# Patient Record
Sex: Female | Born: 1970 | Smoking: Never smoker
Health system: Southern US, Community
[De-identification: ages and names within clinical notes are randomized; demographics above are authoritative.]

## PROBLEM LIST (undated history)

## (undated) DIAGNOSIS — R51 Headache: Secondary | ICD-10-CM

## (undated) DIAGNOSIS — K219 Gastro-esophageal reflux disease without esophagitis: Secondary | ICD-10-CM

## (undated) HISTORY — PX: WISDOM TOOTH EXTRACTION: SHX21

---

## 2005-05-11 ENCOUNTER — Ambulatory Visit: Payer: Self-pay | Admitting: Obstetrics and Gynecology

## 2006-03-18 ENCOUNTER — Ambulatory Visit: Payer: Self-pay | Admitting: Obstetrics and Gynecology

## 2006-06-25 ENCOUNTER — Ambulatory Visit: Payer: Self-pay | Admitting: Obstetrics and Gynecology

## 2006-09-30 ENCOUNTER — Ambulatory Visit: Payer: Self-pay | Admitting: Specialist

## 2007-06-08 ENCOUNTER — Ambulatory Visit: Payer: Self-pay | Admitting: Obstetrics and Gynecology

## 2008-07-06 ENCOUNTER — Ambulatory Visit: Payer: Self-pay | Admitting: Obstetrics and Gynecology

## 2009-07-16 ENCOUNTER — Other Ambulatory Visit: Payer: Self-pay | Admitting: Obstetrics & Gynecology

## 2011-07-27 ENCOUNTER — Ambulatory Visit: Payer: Self-pay | Admitting: General Practice

## 2011-08-11 ENCOUNTER — Ambulatory Visit: Payer: Self-pay | Admitting: General Practice

## 2011-08-12 ENCOUNTER — Ambulatory Visit: Payer: Self-pay | Admitting: General Practice

## 2011-09-02 ENCOUNTER — Encounter (HOSPITAL_BASED_OUTPATIENT_CLINIC_OR_DEPARTMENT_OTHER): Payer: Self-pay | Admitting: *Deleted

## 2011-09-09 NOTE — H&P (Signed)
  A Division of [facility]  Loreta Ave, M.D.     Robert A. Thurston Hole, M.D.     Lunette Stands, M.D. Eulas Post, M.D.    Buford Dresser, M.D. Estell Harpin, M.D. Ralene Cork, D.O.          Genene Churn. Barry Dienes, PA-C            Kirstin A. Shepperson, PA-C [place], OPA-C   RE: [name], [name]                                [id number]      DOB: 03-14-1971 PROGRESS NOTE: 08-18-11 Chief complaint: Left knee pain and instability.  History of present illness: 41 year-old female who is a new patient to the office.  Presents with the above complaint.  States that on July 26, 2011 she was playing basketball when she was bumped from the right side and she suffered a varus type stress to her left knee.  Did not have much pain after this incident, but she felt like her knee was unstable.  Swelling after.  Treated conservatively for a few weeks and eventually had an MRI on Aug 12, 2011.  This showed an ACL tear and edema noted along the posterolateral aspect of the tibial plateau and about the popliteus tendon.  Posterolateral corner syndrome cannot be excluded.  Patient employed as a Engineer, maintenance and was referred to our office by her employer, Dr. Rushie Chestnut.   Current medications: Mobic. Allergies: No known drug allergies. Past medical/surgical history: Healthy. Review of systems: All other systems are unremarkable.   Family history: Positive for hypertension, heart disease and arthritis. Social history: Does not smoke, admits occasional alcohol use.  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    EXAMINATION: Height: 5?0.  Weight: 136 pounds.  Pleasant Hispanic female, alert and oriented x 3 and in no acute distress.  Gait is somewhat antalgic.  Negative log roll bilateral hips.  Negative straight leg raise.  Left knee good range of motion.  She does have some swelling with a small effusion.  Positive  anterior drawer and Lachman.  Moderately tender over the medial joint line.  Collateral ligaments stable.  Negative patellar apprehension.  Calf non-tender.  Neurovascularly intact.  Skin warm and dry.  No increase in respiratory effort.    X-RAYS: Left knee, AP, lateral and sunrise views, show good bony anatomy.  No acute changes.    IMPRESSION: Left knee pain and instability secondary to ACL tear.    PLAN: Patient and her sister, who is present, advised that the only viable option at this point would be left knee arthroscopy with debridement and allograft ACL reconstruction.  Surgical procedure, along with potential rehab/recovery time discussed.  All questions answered.  She is very tender over her medial meniscus and we will have meniscal repair system available, even though her scan did not show an obvious meniscus tear.  She is given a J&J brace today.  With the job that she does anticipate being out of work for at least 6-12 weeks, unless there is some light duty deskwork available to her.  All questions answered.    Loreta Ave, M.D.   Electronically verified by Loreta Ave, M.D. DFM(JMO):jjh D 08-19-11

## 2011-09-10 ENCOUNTER — Encounter (HOSPITAL_BASED_OUTPATIENT_CLINIC_OR_DEPARTMENT_OTHER)
Admission: RE | Disposition: A | Discharge status: Home / Self Care | Payer: Self-pay | Source: Ambulatory Visit | Attending: Orthopedic Surgery

## 2011-09-10 ENCOUNTER — Encounter (HOSPITAL_BASED_OUTPATIENT_CLINIC_OR_DEPARTMENT_OTHER): Payer: Self-pay

## 2011-09-10 ENCOUNTER — Encounter (HOSPITAL_BASED_OUTPATIENT_CLINIC_OR_DEPARTMENT_OTHER): Payer: Self-pay | Admitting: Anesthesiology

## 2011-09-10 ENCOUNTER — Ambulatory Visit (HOSPITAL_BASED_OUTPATIENT_CLINIC_OR_DEPARTMENT_OTHER)
Admission: RE | Admit: 2011-09-10 | Discharge: 2011-09-10 | Disposition: A | Discharge status: Home / Self Care | Payer: PRIVATE HEALTH INSURANCE | Source: Ambulatory Visit | Attending: Orthopedic Surgery | Admitting: Orthopedic Surgery

## 2011-09-10 ENCOUNTER — Ambulatory Visit (HOSPITAL_BASED_OUTPATIENT_CLINIC_OR_DEPARTMENT_OTHER): Payer: PRIVATE HEALTH INSURANCE | Admitting: Anesthesiology

## 2011-09-10 DIAGNOSIS — M23302 Other meniscus derangements, unspecified lateral meniscus, unspecified knee: Secondary | ICD-10-CM | POA: Insufficient documentation

## 2011-09-10 DIAGNOSIS — M2350 Chronic instability of knee, unspecified knee: Secondary | ICD-10-CM | POA: Insufficient documentation

## 2011-09-10 DIAGNOSIS — Z4789 Encounter for other orthopedic aftercare: Secondary | ICD-10-CM

## 2011-09-10 HISTORY — DX: Gastro-esophageal reflux disease without esophagitis: K21.9

## 2011-09-10 HISTORY — DX: Headache: R51

## 2011-09-10 SURGERY — KNEE ARTHROSCOPY WITH ANTERIOR CRUCIATE LIGAMENT (ACL) REPAIR
Anesthesia: General | Site: Knee | Laterality: Left | Wound class: Clean

## 2011-09-10 MED ORDER — CHLORHEXIDINE GLUCONATE 4 % EX LIQD: 60.0000 mL | Freq: Once | CUTANEOUS | Status: DC

## 2011-09-10 MED ORDER — PROPOFOL 10 MG/ML IV EMUL
INTRAVENOUS | Status: DC | PRN
  Administered 2011-09-10: 160 mg via INTRAVENOUS

## 2011-09-10 MED ORDER — PROMETHAZINE HCL 25 MG/ML IJ SOLN
6.2500 mg | INTRAMUSCULAR | Status: DC | PRN
  Administered 2011-09-10 (×2): 6.25 mg via INTRAVENOUS

## 2011-09-10 MED ORDER — LIDOCAINE HCL (CARDIAC) 20 MG/ML IV SOLN
INTRAVENOUS | Status: DC | PRN
  Administered 2011-09-10: 100 mg via INTRAVENOUS

## 2011-09-10 MED ORDER — HYDROMORPHONE HCL PF 1 MG/ML IJ SOLN
0.2500 mg | INTRAMUSCULAR | Status: DC | PRN
  Administered 2011-09-10: 0.5 mg via INTRAVENOUS

## 2011-09-10 MED ORDER — FENTANYL CITRATE 0.05 MG/ML IJ SOLN
INTRAMUSCULAR | Status: DC | PRN
  Administered 2011-09-10: 50 ug via INTRAVENOUS
  Administered 2011-09-10 (×2): 25 ug via INTRAVENOUS

## 2011-09-10 MED ORDER — SCOPOLAMINE 1 MG/3DAYS TD PT72
MEDICATED_PATCH | TRANSDERMAL | Status: DC | PRN
  Administered 2011-09-10: 1.5 mg via TRANSDERMAL

## 2011-09-10 MED ORDER — LACTATED RINGERS IV SOLN
INTRAVENOUS | Status: DC
  Administered 2011-09-10 (×4): via INTRAVENOUS

## 2011-09-10 MED ORDER — MIDAZOLAM HCL 2 MG/2ML IJ SOLN
1.0000 mg | INTRAMUSCULAR | Status: DC | PRN
  Administered 2011-09-10: 2 mg via INTRAVENOUS

## 2011-09-10 MED ORDER — DEXAMETHASONE SODIUM PHOSPHATE 10 MG/ML IJ SOLN
INTRAMUSCULAR | Status: DC | PRN
  Administered 2011-09-10: 4 mg

## 2011-09-10 MED ORDER — OXYCODONE-ACETAMINOPHEN 5-325 MG PO TABS
1.0000 | ORAL_TABLET | Freq: Once | ORAL | Status: AC
  Administered 2011-09-10: 1 via ORAL

## 2011-09-10 MED ORDER — LORAZEPAM 2 MG/ML IJ SOLN: 1.0000 mg | Freq: Once | INTRAMUSCULAR | Status: DC | PRN

## 2011-09-10 MED ORDER — FENTANYL CITRATE 0.05 MG/ML IJ SOLN
50.0000 ug | INTRAMUSCULAR | Status: DC | PRN
  Administered 2011-09-10: 100 ug via INTRAVENOUS

## 2011-09-10 MED ORDER — CEFAZOLIN SODIUM-DEXTROSE 2-3 GM-% IV SOLR
2.0000 g | INTRAVENOUS | Status: AC
  Administered 2011-09-10: 2 g via INTRAVENOUS

## 2011-09-10 MED ORDER — DEXAMETHASONE SODIUM PHOSPHATE 4 MG/ML IJ SOLN
INTRAMUSCULAR | Status: DC | PRN
  Administered 2011-09-10: 10 mg via INTRAVENOUS

## 2011-09-10 MED ORDER — ONDANSETRON HCL 4 MG/2ML IJ SOLN
INTRAMUSCULAR | Status: DC | PRN
  Administered 2011-09-10 (×2): 4 mg via INTRAVENOUS

## 2011-09-10 MED ORDER — BUPIVACAINE-EPINEPHRINE PF 0.5-1:200000 % IJ SOLN
INTRAMUSCULAR | Status: DC | PRN
  Administered 2011-09-10: 25 mL

## 2011-09-10 SURGICAL SUPPLY — 88 items
ANCHOR PUSHLOCK PEEK 3.5X19.5 (Anchor) ×2 IMPLANT
BANDAGE ELASTIC 4 VELCRO ST LF (GAUZE/BANDAGES/DRESSINGS) IMPLANT
BANDAGE ELASTIC 6 VELCRO ST LF (GAUZE/BANDAGES/DRESSINGS) IMPLANT
BANDAGE ESMARK 6X9 LF (GAUZE/BANDAGES/DRESSINGS) ×1 IMPLANT
BENZOIN TINCTURE PRP APPL 2/3 (GAUZE/BANDAGES/DRESSINGS) ×2 IMPLANT
BIOSCREW 8X25 (Screw) ×2 IMPLANT
BIOSCREW 9X25 (Screw) ×2 IMPLANT
BIT DRILL 67X1.5XWRPS STRL (BIT) ×1 IMPLANT
BIT DRILL QC 3.5X195 (BIT) ×2 IMPLANT
BIT DRL 67X1.5XWRPS STRL (BIT) ×1
BLADE 4.2CUDA (BLADE) IMPLANT
BLADE AVERAGE 25X9 (BLADE) ×2 IMPLANT
BLADE CUDA 5.5 (BLADE) IMPLANT
BLADE CUDA GRT WHITE 3.5 (BLADE) IMPLANT
BLADE CUTTER GATOR 3.5 (BLADE) ×2 IMPLANT
BLADE CUTTER MENIS 5.5 (BLADE) IMPLANT
BLADE GREAT WHITE 4.2 (BLADE) ×2 IMPLANT
BLADE SURG 15 STRL LF DISP TIS (BLADE) ×1 IMPLANT
BLADE SURG 15 STRL SS (BLADE) ×1
BNDG ESMARK 6X9 LF (GAUZE/BANDAGES/DRESSINGS) ×2
BUR EGG 3PK/BX (BURR) ×2 IMPLANT
BUR OVAL 6.0 (BURR) ×2 IMPLANT
CANISTER OMNI JUG 16 LITER (MISCELLANEOUS) ×2 IMPLANT
CANISTER SUCTION 2500CC (MISCELLANEOUS) IMPLANT
CLOTH BEACON ORANGE TIMEOUT ST (SAFETY) ×2 IMPLANT
COVER TABLE BACK 60X90 (DRAPES) ×2 IMPLANT
CUFF TOURNIQUET SINGLE 34IN LL (TOURNIQUET CUFF) ×2 IMPLANT
CUTTER MENISCUS  4.2MM (BLADE)
CUTTER MENISCUS 4.2MM (BLADE) IMPLANT
DECANTER SPIKE VIAL GLASS SM (MISCELLANEOUS) IMPLANT
DRAPE ARTHROSCOPY W/POUCH 114 (DRAPES) ×2 IMPLANT
DRAPE U-SHAPE 47X51 STRL (DRAPES) ×2 IMPLANT
DRILL BIT WIRE PASS (BIT) ×1
DURAPREP 26ML APPLICATOR (WOUND CARE) ×2 IMPLANT
ELECT MENISCUS 165MM 90D (ELECTRODE) IMPLANT
ELECT REM PT RETURN 9FT ADLT (ELECTROSURGICAL) ×2
ELECTRODE REM PT RTRN 9FT ADLT (ELECTROSURGICAL) ×1 IMPLANT
GAUZE XEROFORM 1X8 LF (GAUZE/BANDAGES/DRESSINGS) IMPLANT
GLOVE BIO SURGEON STRL SZ 6.5 (GLOVE) ×4 IMPLANT
GLOVE BIOGEL PI IND STRL 7.0 (GLOVE) ×1 IMPLANT
GLOVE BIOGEL PI IND STRL 7.5 (GLOVE) ×1 IMPLANT
GLOVE BIOGEL PI IND STRL 8 (GLOVE) ×1 IMPLANT
GLOVE BIOGEL PI INDICATOR 7.0 (GLOVE) ×1
GLOVE BIOGEL PI INDICATOR 7.5 (GLOVE) ×1
GLOVE BIOGEL PI INDICATOR 8 (GLOVE) ×1
GLOVE ORTHO TXT STRL SZ7.5 (GLOVE) ×4 IMPLANT
GOWN BRE IMP PREV XXLGXLNG (GOWN DISPOSABLE) ×2 IMPLANT
GOWN PREVENTION PLUS XLARGE (GOWN DISPOSABLE) IMPLANT
GRAFT ACHILLES TENDON (Bone Implant) ×2 IMPLANT
IMMOBILIZER KNEE 22 UNIV (SOFTGOODS) ×2 IMPLANT
IMMOBILIZER KNEE 24 THIGH 36 (MISCELLANEOUS) IMPLANT
IMMOBILIZER KNEE 24 UNIV (MISCELLANEOUS)
KNEE WRAP E Z 3 GEL PACK (MISCELLANEOUS) ×2 IMPLANT
KNIFE GRAFT ACL 10MM 5952 (MISCELLANEOUS) IMPLANT
KNIFE GRAFT ACL 9MM (MISCELLANEOUS) IMPLANT
NEEDLE MENISCAL REPAIR DBL ARM (NEEDLE) IMPLANT
NS IRRIG 1000ML POUR BTL (IV SOLUTION) ×2 IMPLANT
PACK ARTHROSCOPY DSU (CUSTOM PROCEDURE TRAY) ×2 IMPLANT
PACK BASIN DAY SURGERY FS (CUSTOM PROCEDURE TRAY) ×2 IMPLANT
PAD CAST 4YDX4 CTTN HI CHSV (CAST SUPPLIES) ×1 IMPLANT
PADDING CAST COTTON 4X4 STRL (CAST SUPPLIES) ×1
PADDING CAST COTTON 6X4 STRL (CAST SUPPLIES) ×2 IMPLANT
PASSER SUT SWANSON 36MM LOOP (INSTRUMENTS) IMPLANT
PENCIL BUTTON HOLSTER BLD 10FT (ELECTRODE) ×2 IMPLANT
SCREW BIO 8X25 (Screw) ×1 IMPLANT
SCREW BIO 9X25 (Screw) ×1 IMPLANT
SET ARTHROSCOPY TUBING (MISCELLANEOUS) ×1
SET ARTHROSCOPY TUBING LN (MISCELLANEOUS) ×1 IMPLANT
SLEEVE SCD COMPRESS KNEE MED (MISCELLANEOUS) IMPLANT
SPONGE GAUZE 4X4 12PLY (GAUZE/BANDAGES/DRESSINGS) ×4 IMPLANT
SPONGE LAP 4X18 X RAY DECT (DISPOSABLE) IMPLANT
STRIP CLOSURE SKIN 1/2X4 (GAUZE/BANDAGES/DRESSINGS) ×2 IMPLANT
SUCTION FRAZIER TIP 10 FR DISP (SUCTIONS) IMPLANT
SUT 2 FIBERLOOP 20 STRT BLUE (SUTURE) ×4
SUT ETHILON 3 0 PS 1 (SUTURE) IMPLANT
SUT FIBERWIRE #2 38 T-5 BLUE (SUTURE) ×8
SUT VIC AB 0 CT1 27 (SUTURE)
SUT VIC AB 0 CT1 27XBRD ANBCTR (SUTURE) IMPLANT
SUT VIC AB 2-0 SH 27 (SUTURE) ×1
SUT VIC AB 2-0 SH 27XBRD (SUTURE) ×1 IMPLANT
SUT VIC AB 3-0 SH 27 (SUTURE) ×1
SUT VIC AB 3-0 SH 27X BRD (SUTURE) ×1 IMPLANT
SUT VICRYL 4-0 PS2 18IN ABS (SUTURE) IMPLANT
SUTURE 2 FIBERLOOP 20 STRT BLU (SUTURE) ×2 IMPLANT
SUTURE FIBERWR #2 38 T-5 BLUE (SUTURE) ×4 IMPLANT
TOWEL OR 17X24 6PK STRL BLUE (TOWEL DISPOSABLE) ×2 IMPLANT
TOWEL OR NON WOVEN STRL DISP B (DISPOSABLE) ×2 IMPLANT
WATER STERILE IRR 1000ML POUR (IV SOLUTION) ×2 IMPLANT

## 2011-09-10 NOTE — Brief Op Note (Signed)
09/10/2011  11:01 AM  PATIENT:  Georgiann Hahn  41 y.o. female  PRE-OPERATIVE DIAGNOSIS:  left knee anterior cruciate ligament tear lateral meniscus tear  POST-OPERATIVE DIAGNOSIS:  same as preop  PROCEDURE:  Procedure(s) (LRB): KNEE ARTHROSCOPY WITH ANTERIOR CRUCIATE LIGAMENT (ACL) REPAIR (Left), debridement  SURGEON:  Surgeon(s) and Role:    * Loreta Ave, MD - Primary  PHYSICIAN ASSISTANT: Zonia Kief M    ANESTHESIA:   regional and general  EBL:  Total I/O In: 2000 [I.V.:2000] Out: -   SPECIMEN:  No Specimen  DISPOSITION OF SPECIMEN:  N/A  COUNTS:  YES  TOURNIQUET:   Total Tourniquet Time Documented: Thigh (Left) - 62 minutes  PATIENT DISPOSITION:  PACU - hemodynamically stable.

## 2011-09-10 NOTE — Transfer of Care (Signed)
Immediate Anesthesia Transfer of Care Note  Patient: [name] [name]  Procedure(s) Performed: Procedure(s) (LRB): KNEE ARTHROSCOPY WITH ANTERIOR CRUCIATE LIGAMENT (ACL) REPAIR (Left)  Patient Location: PACU  Anesthesia Type: GA combined with regional for post-op pain  Level of Consciousness: sedated  Airway & Oxygen Therapy: Patient Spontanous Breathing and Patient connected to face mask oxygen  Post-op Assessment: Report given to PACU RN and Post -op Vital signs reviewed and stable  Post vital signs: Reviewed and stable  Complications: No apparent anesthesia complications

## 2011-09-10 NOTE — Op Note (Signed)
[name], [name]                ACCOUNT NO.:  [id number]  MEDICAL RECORD NO.:  [id number]  LOCATION:                                 FACILITY:  PHYSICIAN:  Loreta Ave, M.D.      DATE OF BIRTH:  DATE OF PROCEDURE:  09/10/2011 DATE OF DISCHARGE:                              OPERATIVE REPORT   PREOPERATIVE DIAGNOSIS:  Left knee anterior cruciate ligament tear with anterolateral rotary instability.  POSTOPERATIVE DIAGNOSIS:  Left knee anterior cruciate ligament tear with anterolateral rotary instability with small radial tears of middle third and lateral meniscus.  PROCEDURE:  Left knee exam under anesthesia, arthroscopy.  Debridement of lateral meniscus.  Arthroscopic endoscopic ACL reconstruction, Achilles allograft.  Notchplasty.  Bioabsorbable screw fixation above and below with a PushLock distally.  SURGEON:  Loreta Ave, MD  ASSISTANT:  Genene Churn. Denton Meek., present throughout the entire case and necessary for timely completion of procedure.  ANESTHESIA:  General.  BLOOD LOSS:  Minimal.  SPECIMENS:  None.  CULTURES:  None.  COMPLICATION:  None.  DRESSINGS:  Soft compressive knee immobilizer.  TOURNIQUET TIME:  One hour.  PROCEDURE:  The patient was brought to the operating room and placed on the operating table in supine position.  After adequate anesthesia had been obtained, left knee was examined.  Full motion.  Positive Lachman, drawer and pivot shift.  Tourniquet was applied.  Prepped and draped in usual sterile fashion.  Exsanguinated with elevation of Esmarch. Tourniquet was inflated to 350 mmHg.  An Achilles allograft was prepared for 10-mm tunnels.  Two incisions were made either side of the patellar tendon, anterolateral and anteromedial.  Arthroscope was induced, knee was distended and inspected.  Articular cartilage was intact throughout. Good patellar tracking.  Medial meniscus, medial compartment normal. Laterally, small radial tears  midportion, lateral meniscus saucerized out and tapered smoothly.  ACL complete midsubstance tear irreparable. Debrided.  PCL intact.  Moderate narrowing of the notch opened with notchplasty.  An incision was made medial to the tibial tubercle. Guidewire was passed from there up through the footprint of the ACL. Overdrilled with a 10-mm reamer.  Debris was cleared with a shaver. Femoral guide was inserted across the tibial tunnel notch and on the back cortex of femur.  Guidewire was driven and overdrilled with a 10-mm reamer for appropriate depth for the graft and pegs.  Debris was cleared throughout the knee.  Tunnel was assessed and found to be in good position.  Tubing Passer was inserted across both tunnels and out through a stab wound, anterolateral thigh.  Nitinol wire was brought through the medial portal out through the femoral tunnel.  Graft was attached to the tubing Passer, pulled in across the knee with the bone peg up in the femoral tunnel.  Fixed there over a Nitinol wire with an 8 x 25 bioabsorbable screw.  Good capturing and fixation.  Graft was tensioned to 70 degrees.  The tibial portion was fixed with a 9 x 25 screw over Nitinol wire.  Exiting sutures were brought out and anchored in the tibia distal to the entrance of the whole with a predrilled 3.5- mm PushLock.  At completion, good stability and fixation of the graft above and below.  Excellent stability in flexion, extension.  Good clearance of the graft and brought through full motion.  All wounds had been irrigated.  Incision was closed with subcutaneous and subcuticular Vicryl.  Portals were closed with nylon.  Sterile compressive dressing was applied.  Tourniquet was deflated and removed.  Knee immobilizer was applied.  Anesthesia was reversed.  Brought to the recovery room. Tolerated the surgery well.  No complications.     Loreta Ave, M.D.     DFM/MEDQ  D:  09/10/2011  T:  09/10/2011  Job:   [id number]

## 2011-09-10 NOTE — Anesthesia Preprocedure Evaluation (Signed)
Anesthesia Evaluation  Patient identified by MRN, date of birth, ID band Patient awake    Reviewed: Allergy & Precautions, H&P , NPO status , Patient's Chart, lab work & pertinent test results  Airway Mallampati: I TM Distance: >3 FB Neck ROM: Full    Dental   Pulmonary    Pulmonary exam normal       Cardiovascular     Neuro/Psych  Headaches,    GI/Hepatic GERD-  ,  Endo/Other    Renal/GU      Musculoskeletal   Abdominal   Peds  Hematology   Anesthesia Other Findings   Reproductive/Obstetrics                           Anesthesia Physical Anesthesia Plan  ASA: I  Anesthesia Plan: General   Post-op Pain Management:    Induction: Intravenous  Airway Management Planned: LMA  Additional Equipment:   Intra-op Plan:   Post-operative Plan: Extubation in OR  Informed Consent: I have reviewed the patients History and Physical, chart, labs and discussed the procedure including the risks, benefits and alternatives for the proposed anesthesia with the patient or authorized representative who has indicated his/her understanding and acceptance.     Plan Discussed with: CRNA and Surgeon  Anesthesia Plan Comments:         Anesthesia Quick Evaluation

## 2011-09-10 NOTE — Interval H&P Note (Signed)
History and Physical Interval Note:  09/10/2011 7:34 AM  [name] [name]  has presented today for surgery, with the diagnosis of left knee acl tear, mmt  The various methods of treatment have been discussed with the patient and family. After consideration of risks, benefits and other options for treatment, the patient has consented to  Procedure(s) (LRB): KNEE ARTHROSCOPY WITH ANTERIOR CRUCIATE LIGAMENT (ACL) REPAIR (Left) as a surgical intervention .  The patients' history has been reviewed, patient examined, no change in status, stable for surgery.  I have reviewed the patients' chart and labs.  Questions were answered to the patient's satisfaction.     Kaio Kuhlman F

## 2011-09-10 NOTE — Discharge Instructions (Signed)

## 2011-09-10 NOTE — Progress Notes (Signed)
Assisted Dr. Kasik with left, femoral block. Side rails up, monitors on throughout procedure. See vital signs in flow sheet. Tolerated Procedure well. 

## 2011-09-10 NOTE — Anesthesia Procedure Notes (Addendum)
Anesthesia Regional Block:  Femoral nerve block  Pre-Anesthetic Checklist: ,, timeout performed, Correct Patient, Correct Site, Correct Laterality, Correct Procedure, Correct Position, site marked, Risks and benefits discussed,  Surgical consent,  Pre-op evaluation,  At surgeon's request and post-op pain management  Laterality: Left  Prep: chloraprep       Needles:  Injection technique: Single-shot  Needle Type: Stimulator Needle - 80      Needle Gauge: 22 and 22 G    Additional Needles:  Procedures: nerve stimulator Femoral nerve block  Nerve Stimulator or Paresthesia:  Response: 0.48 mA,   Additional Responses:   Narrative:  Start time: 09/10/2011 7:23 AM End time: 09/10/2011 7:30 AM Injection made incrementally with aspirations every 5 mL. Anesthesiologist: Dr Gypsy Balsam  Additional Notes: 1610-9604 L Fem Nerve Block POP CHG prep, sterile tech #22 stim needle w/stim down to .48ma Multiple neg asp Marc .5% w/epi 1:200000 total 25cc+decadron 4mg  infiltrated No compl Dr Gypsy Balsam   Procedure Name: LMA Insertion Date/Time: 09/10/2011 8:20 AM Performed by: Burna Cash Pre-anesthesia Checklist: Patient identified, Emergency Drugs available, Suction available and Patient being monitored Patient Re-evaluated:Patient Re-evaluated prior to inductionOxygen Delivery Method: Circle System Utilized Preoxygenation: Pre-oxygenation with 100% oxygen Intubation Type: IV induction Ventilation: Mask ventilation without difficulty LMA: LMA inserted LMA Size: 4.0 Number of attempts: 1 Airway Equipment and Method: bite block Placement Confirmation: positive ETCO2 Tube secured with: Tape Dental Injury: Teeth and Oropharynx as per pre-operative assessment

## 2011-09-10 NOTE — Anesthesia Postprocedure Evaluation (Signed)
  Anesthesia Post-op Note  Patient: [name] [name]  Procedure(s) Performed: Procedure(s) (LRB): KNEE ARTHROSCOPY WITH ANTERIOR CRUCIATE LIGAMENT (ACL) REPAIR (Left)  Patient Location: PACU  Anesthesia Type: GA combined with regional for post-op pain  Level of Consciousness: awake  Airway and Oxygen Therapy: Patient Spontanous Breathing  Post-op Pain: mild  Post-op Assessment: Post-op Vital signs reviewed, Patient's Cardiovascular Status Stable, Respiratory Function Stable, Patent Airway, No signs of Nausea or vomiting, Adequate PO intake and Pain level controlled  Post-op Vital Signs: stable  Complications: No apparent anesthesia complications

## 2011-10-02 ENCOUNTER — Encounter: Payer: Self-pay | Admitting: Orthopedic Surgery

## 2011-10-12 ENCOUNTER — Encounter: Payer: Self-pay | Admitting: Orthopedic Surgery

## 2011-11-12 ENCOUNTER — Encounter: Payer: Self-pay | Admitting: Orthopedic Surgery

## 2012-01-14 ENCOUNTER — Encounter: Payer: Self-pay | Admitting: Orthopedic Surgery

## 2012-02-12 ENCOUNTER — Encounter: Payer: Self-pay | Admitting: Orthopedic Surgery

## 2012-03-13 ENCOUNTER — Encounter: Payer: Self-pay | Admitting: Orthopedic Surgery

## 2013-01-11 IMAGING — CR DG KNEE COMPLETE 4+V*L*
1 series · 4 of 4 positions shown · non-contrast
Comparison: none

REASON FOR EXAM: pain injury swelling fax results [id number]
COMMENTS:

PROCEDURE:     KDR - KDXR KNEE LT COMP WITH OBLIQUES  - July 27, 2011  [DATE]
RESULT:     Comparison:  None

[Series 1: ap · 0.17mm/px · 4 of 4 slices shown]
[im 1/4]
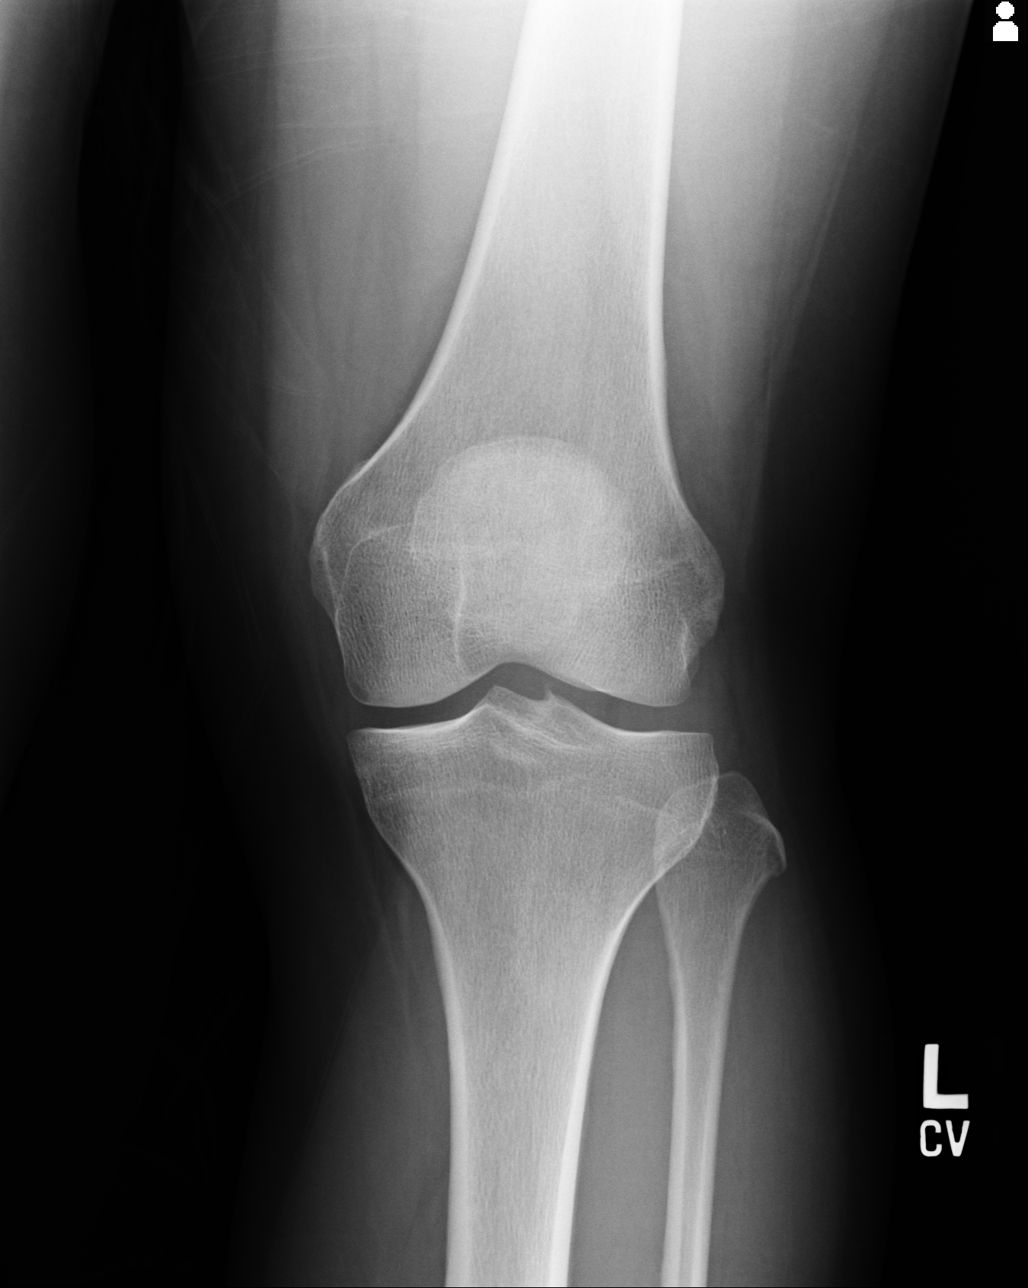
[im 2/4]
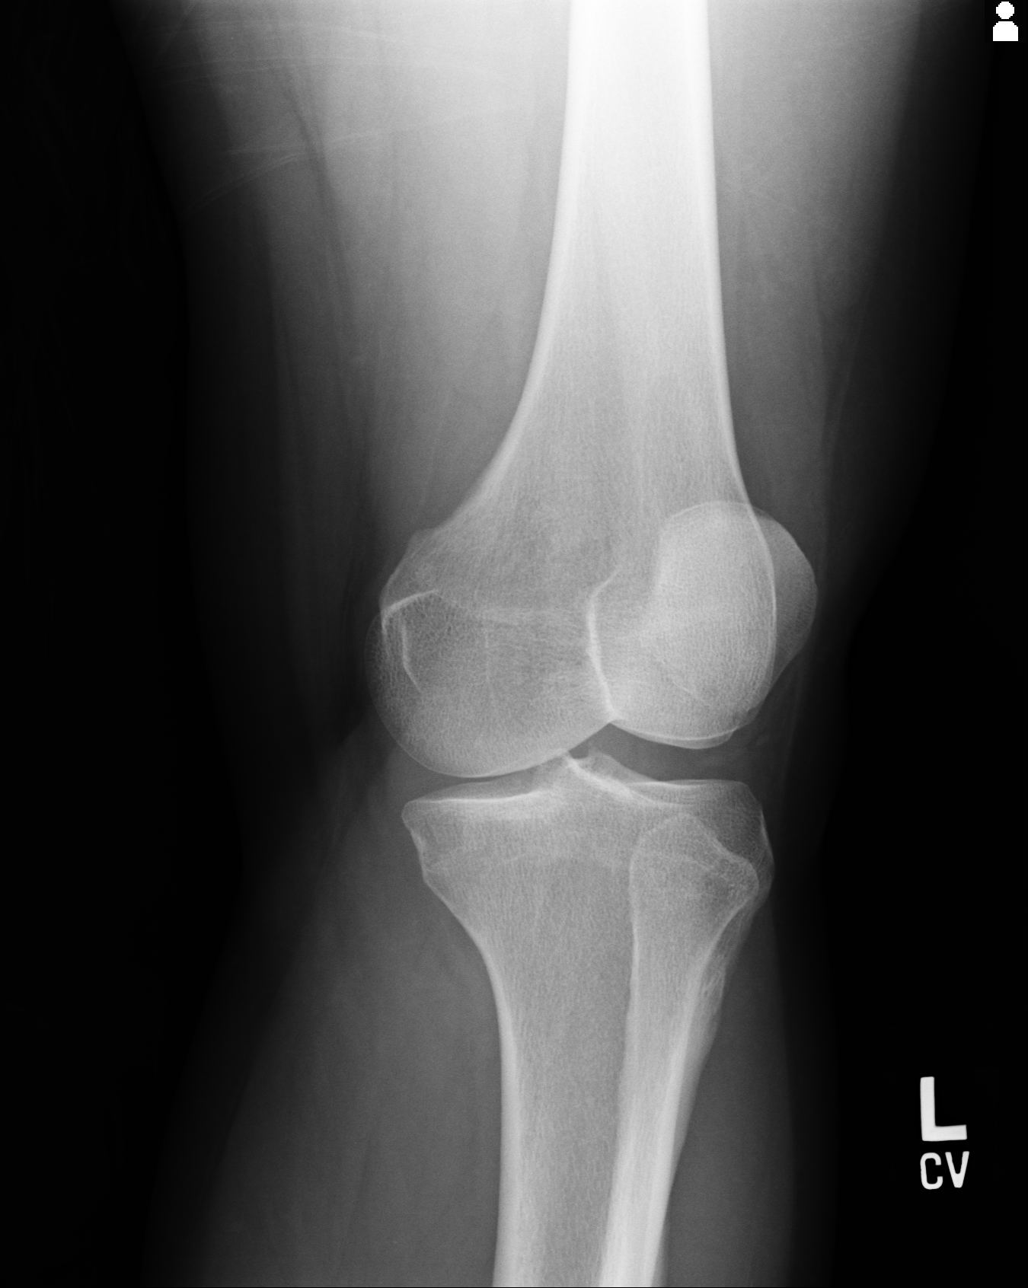
[im 3/4]
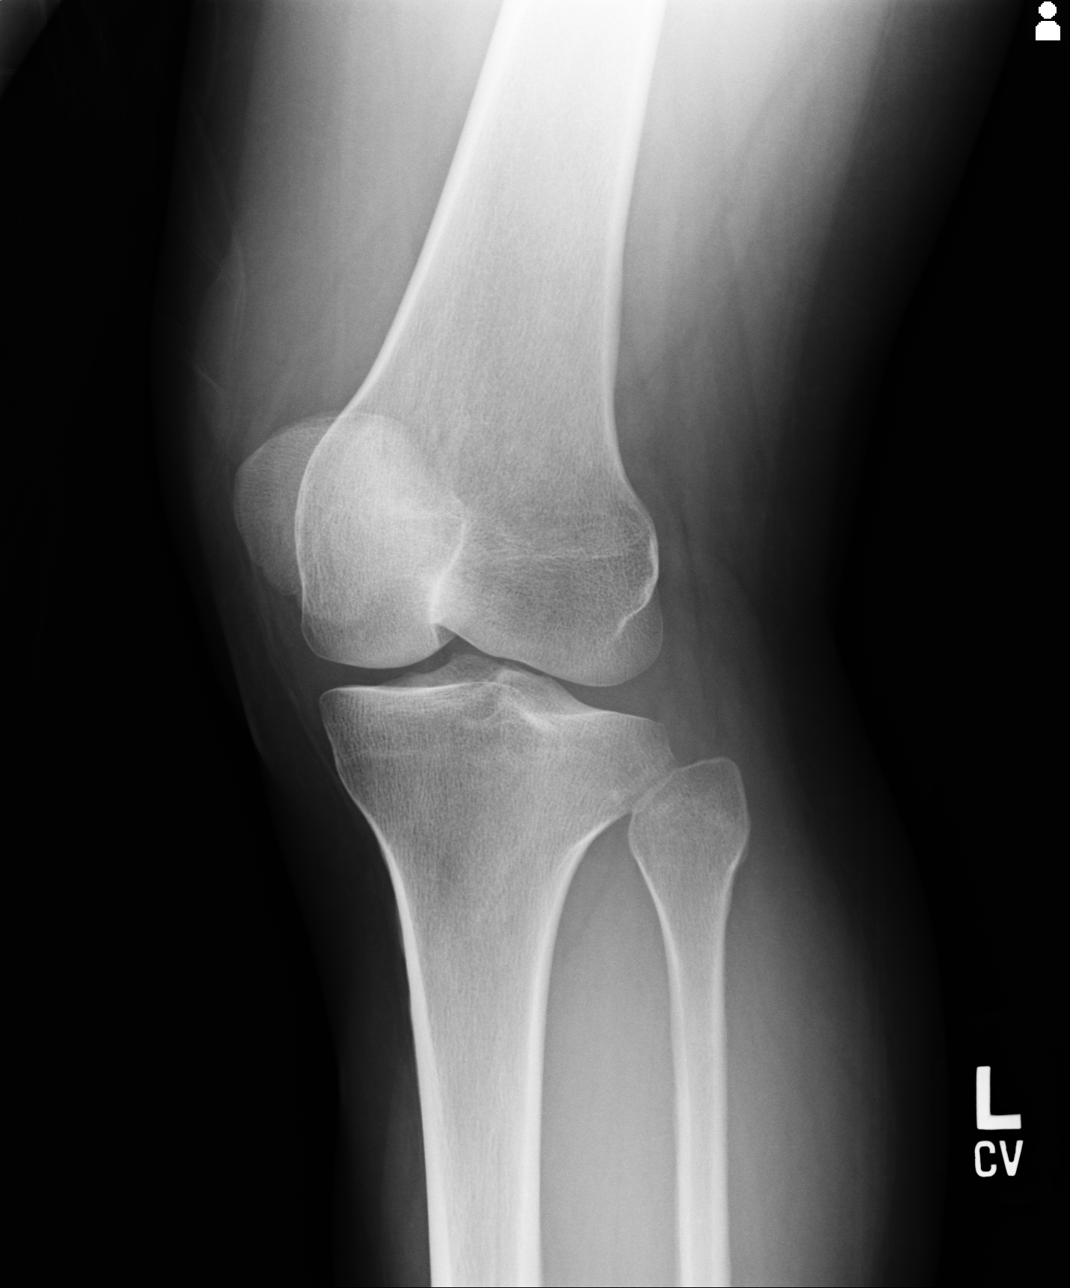
[im 4/4]
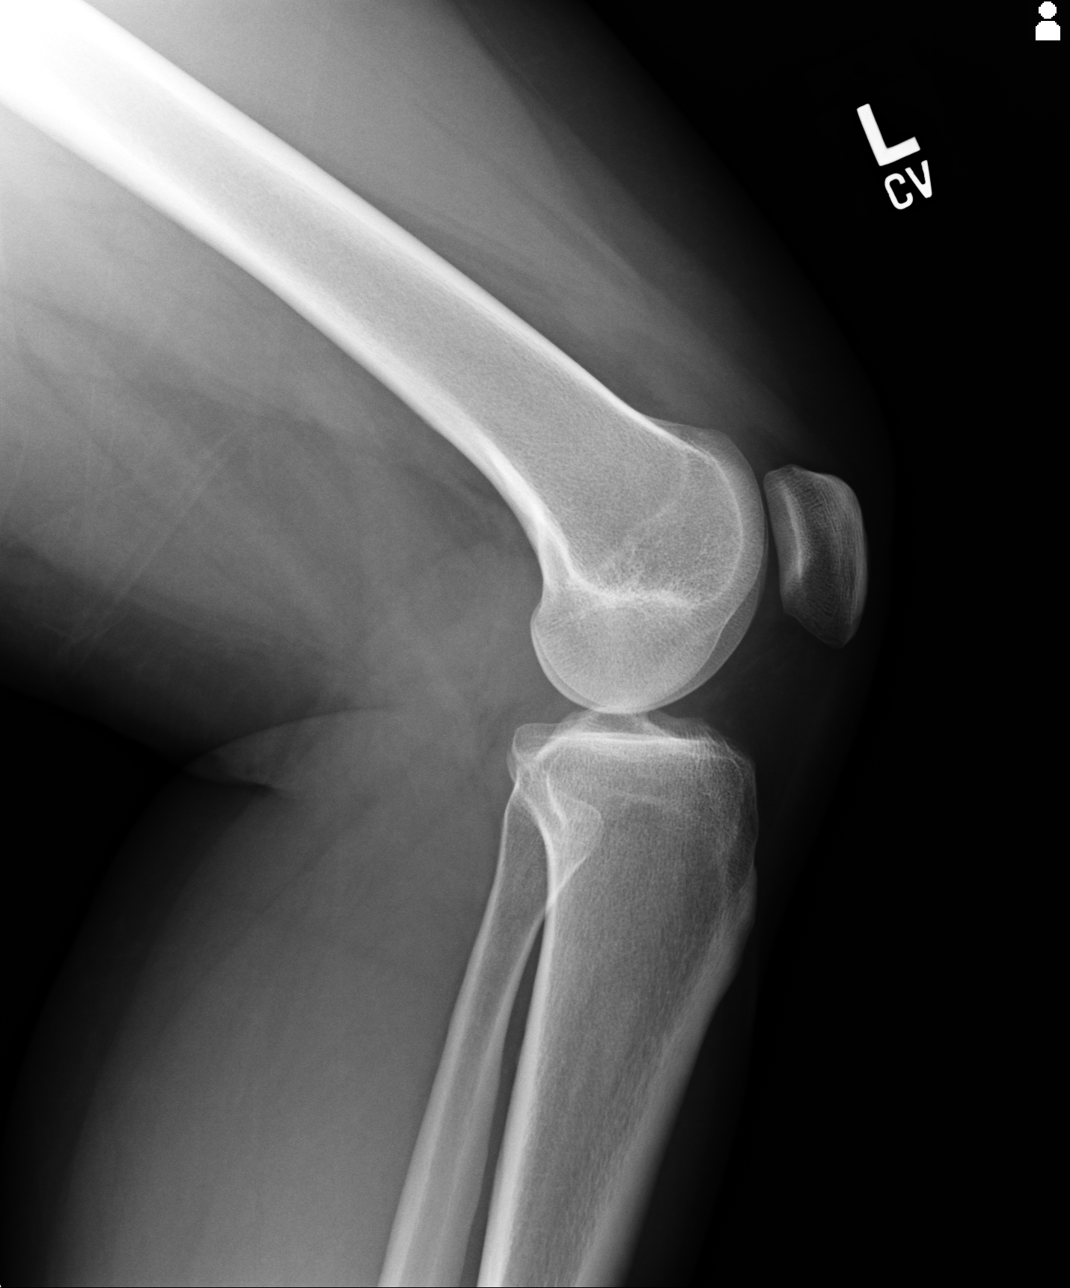

[4 of 4 positions shown; findings below may reference images not displayed]

FINDINGS: 4 views of the left knee demonstrates no acute fracture or dislocation.
There is no significant joint effusion.
IMPRESSION: No acute osseous injury of the left knee.

## 2013-01-26 IMAGING — CR ORBITS FOR FOREIGN BODY - 2 VIEW
1 series · 3 of 3 positions shown · non-contrast
Comparison: none

REASON FOR EXAM: Hx of metal in eye Screen for Residual  fragment prior
to MRI
COMMENTS:

PROCEDURE:     DXR - DXR ORBITS FOR MRI CLEARANCE  - August 11, 2011 [DATE]
RESULT:     Three views of the orbits reveal no evidence of retained
metallic foreign bodies. I see no contraindication to MRI.

[Series 1: w waters pa · 0.14mm/px · 3 of 3 slices shown]
[im 1/3]
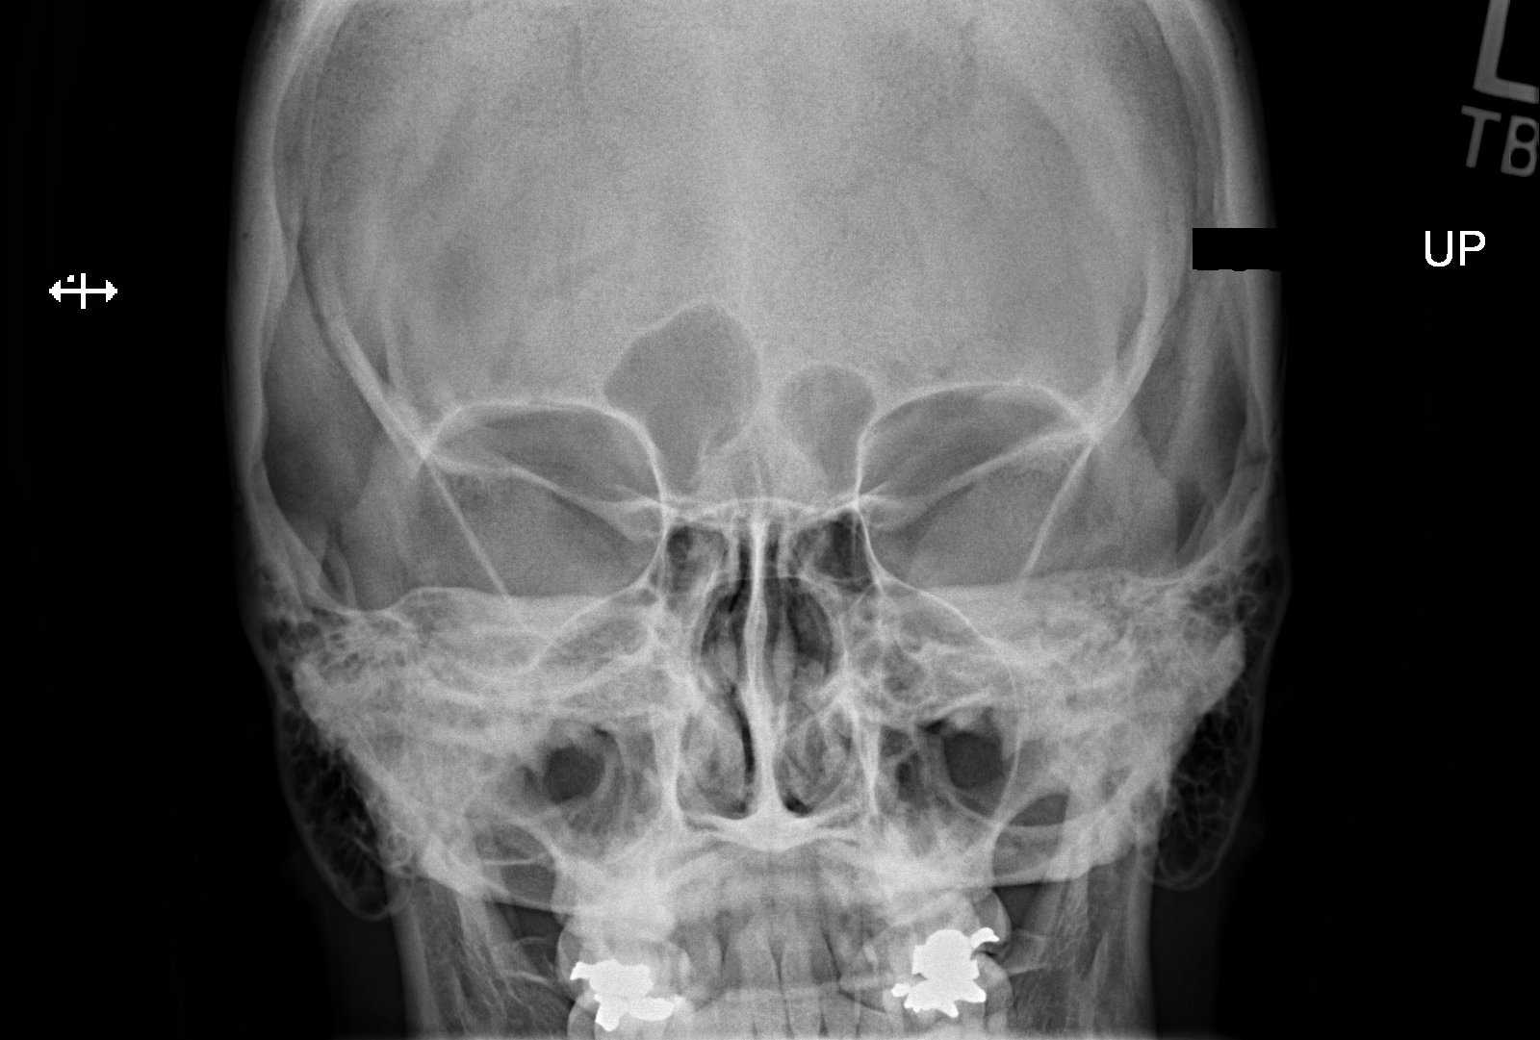
[im 2/3]
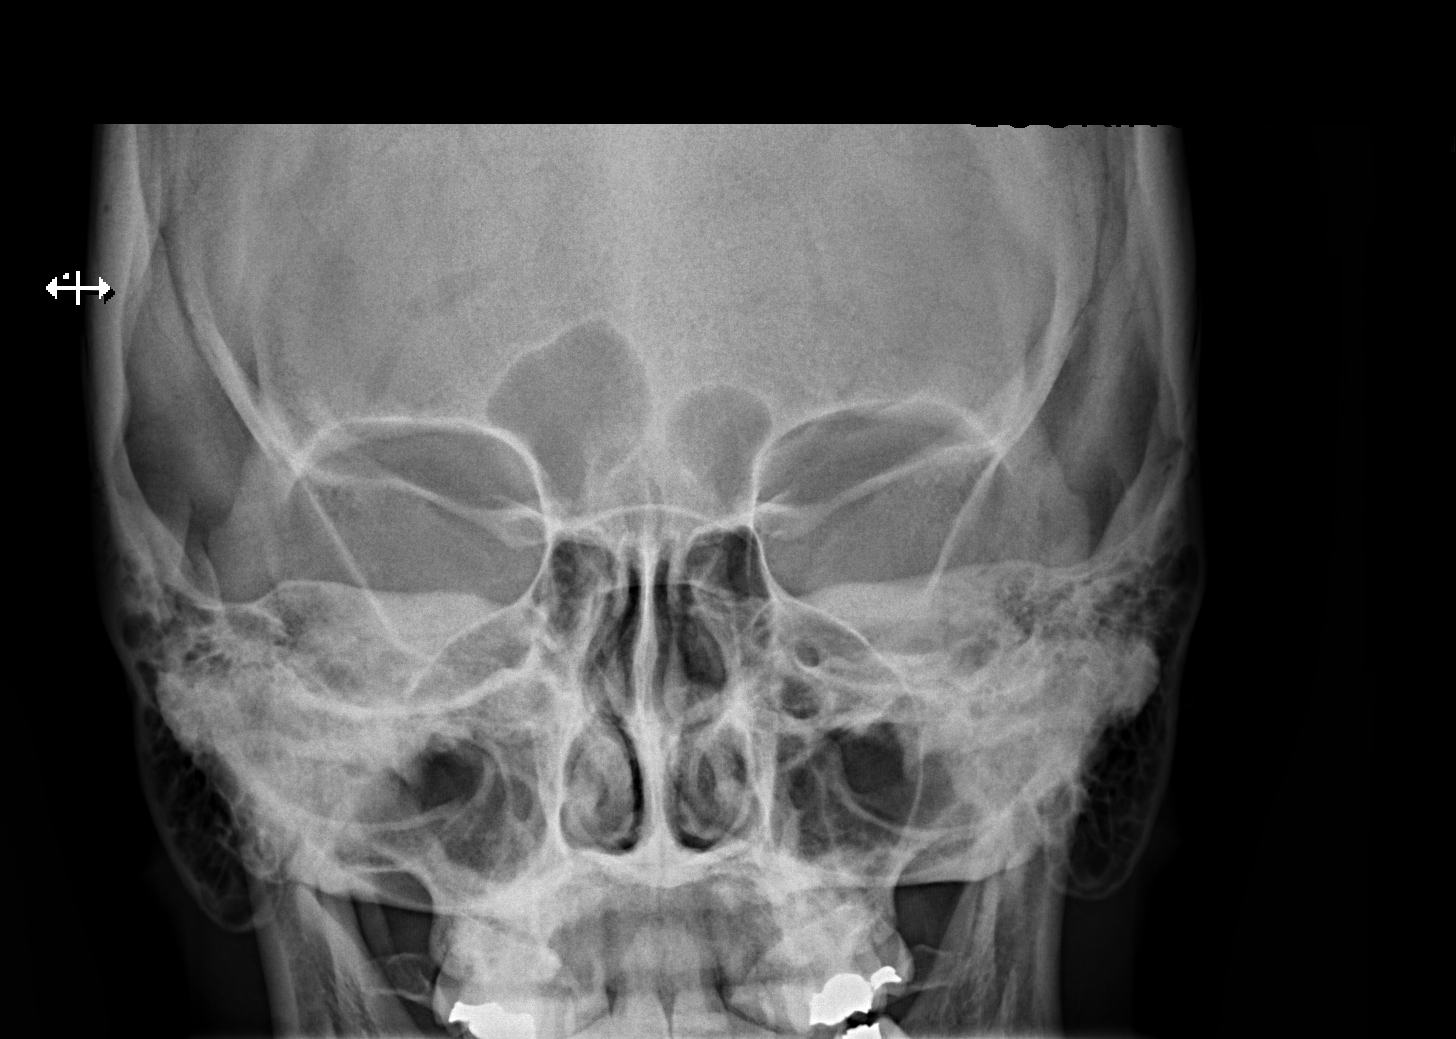
[im 3/3]
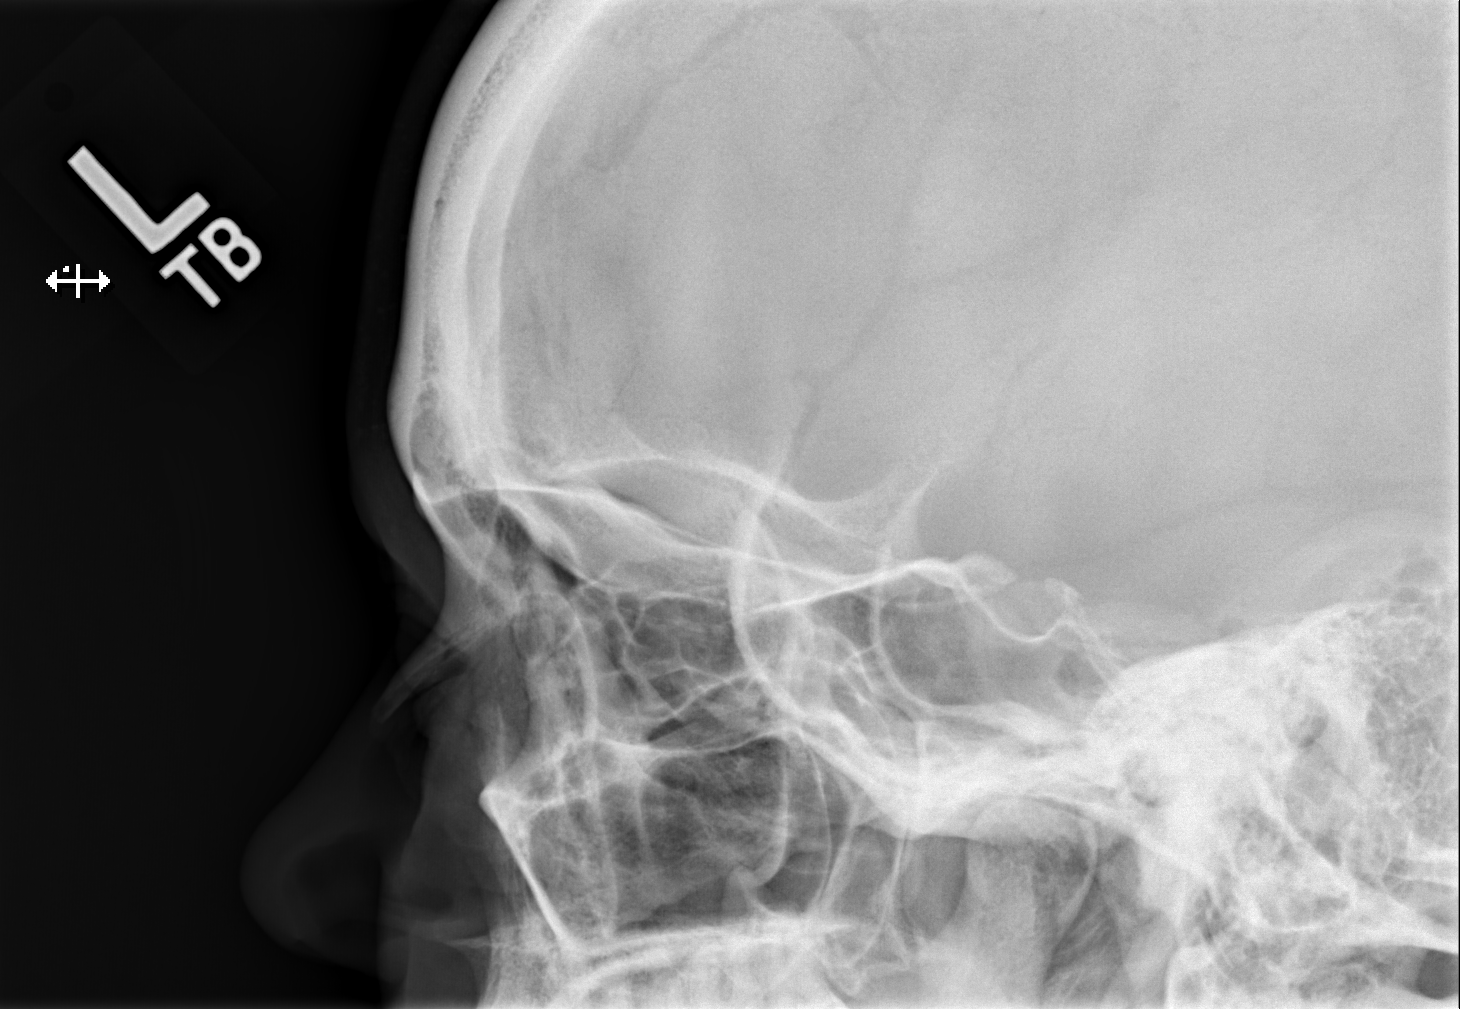

[3 of 3 positions shown; findings below may reference images not displayed]

IMPRESSION: I see no contraindication to MRI.

## 2013-06-03 ENCOUNTER — Ambulatory Visit: Payer: Self-pay | Admitting: Physician Assistant

## 2016-05-05 ENCOUNTER — Telehealth: Payer: Self-pay

## 2016-05-05 NOTE — Telephone Encounter (Signed)
error
